# Patient Record
Sex: Female | Born: 1955 | Race: White | Marital: Married | Smoking: Never smoker
Health system: Southern US, Community
[De-identification: ages and names within clinical notes are randomized; demographics above are authoritative.]

## PROBLEM LIST (undated history)

## (undated) DIAGNOSIS — G4733 Obstructive sleep apnea (adult) (pediatric): Secondary | ICD-10-CM

## (undated) DIAGNOSIS — E119 Type 2 diabetes mellitus without complications: Secondary | ICD-10-CM

## (undated) DIAGNOSIS — M1990 Unspecified osteoarthritis, unspecified site: Secondary | ICD-10-CM

## (undated) DIAGNOSIS — I10 Essential (primary) hypertension: Secondary | ICD-10-CM

## (undated) HISTORY — PX: REPLACEMENT TOTAL KNEE: SUR1224

## (undated) HISTORY — PX: TOTAL SHOULDER REPLACEMENT: SUR1217

---

## 2018-08-29 ENCOUNTER — Ambulatory Visit
Admission: RE | Admit: 2018-08-29 | Discharge: 2018-08-29 | Disposition: A | Discharge status: Home / Self Care | Payer: Medicare Other | Source: Ambulatory Visit | Attending: Orthopaedic Surgery | Admitting: Orthopaedic Surgery

## 2018-08-29 ENCOUNTER — Other Ambulatory Visit: Payer: Self-pay | Admitting: Orthopaedic Surgery

## 2018-08-29 ENCOUNTER — Other Ambulatory Visit: Payer: Self-pay

## 2018-08-29 DIAGNOSIS — M5416 Radiculopathy, lumbar region: Secondary | ICD-10-CM

## 2020-09-01 IMAGING — MR MRI LUMBAR SPINE WITHOUT CONTRAST
4 of 5 series · 25 of 48 positions shown · non-contrast
Comparison: None.

CLINICAL DATA: Chronic low back pain.

EXAM:
MRI LUMBAR SPINE WITHOUT CONTRAST
TECHNIQUE: Multiplanar, multisequence MR imaging of the lumbar spine was
performed. No intravenous contrast was administered.

[Series 6: T2 · sagittal · 4.0mm · 0.84mm/px · 6 of 13 slices shown (1 of 2)]
[im 1/13]
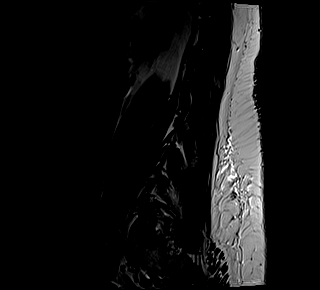
[im 3/13]
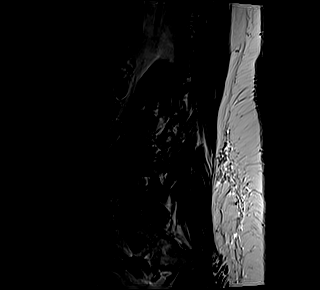
[im 5/13]
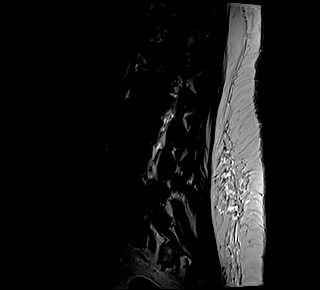
[im 8/13]
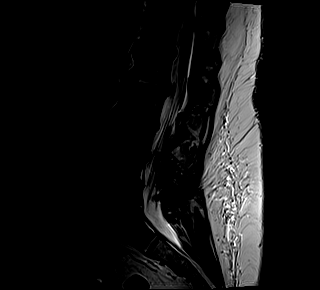
[im 10/13]
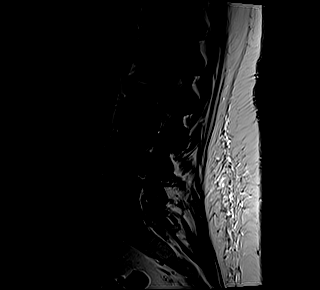
[im 13/13]
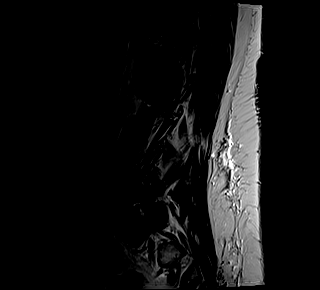

[Series 7: T1 · sagittal · 4.0mm · 0.84mm/px · 6 of 13 slices shown (1 of 2)]
[im 1/13]
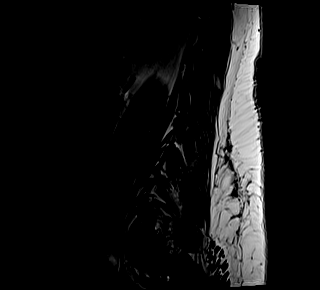
[im 3/13]
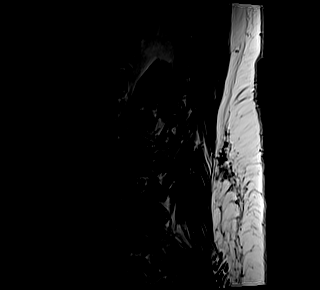
[im 5/13]
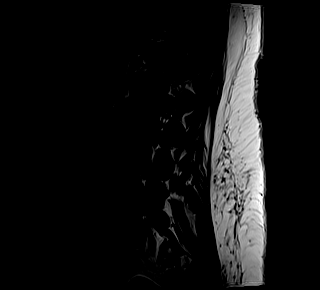
[im 8/13]
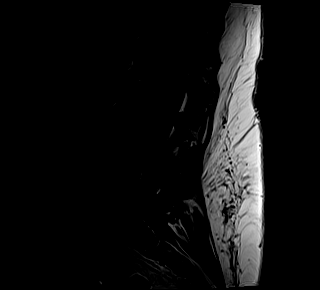
[im 10/13]
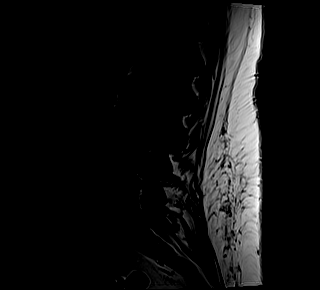
[im 13/13]
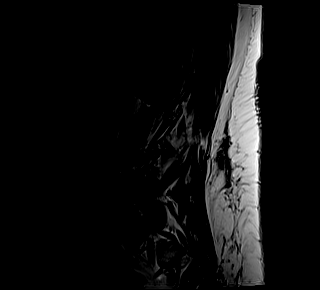

[Series 11: T1 · axial · 4.0mm · 0.35mm/px · z∈[-2,+140]mm · 4 of 34 slices shown (2 of 2)]
[im 1/34]
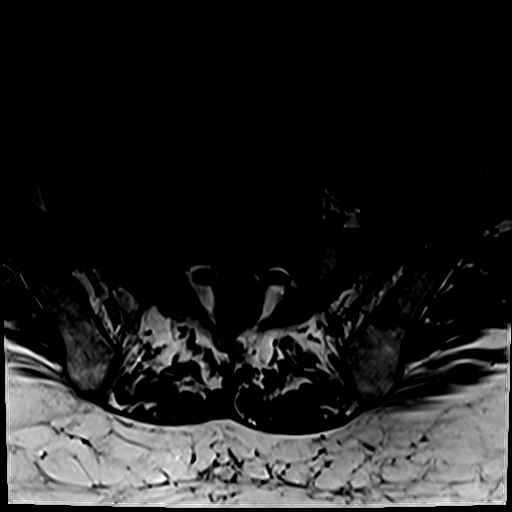
[im 5/34]
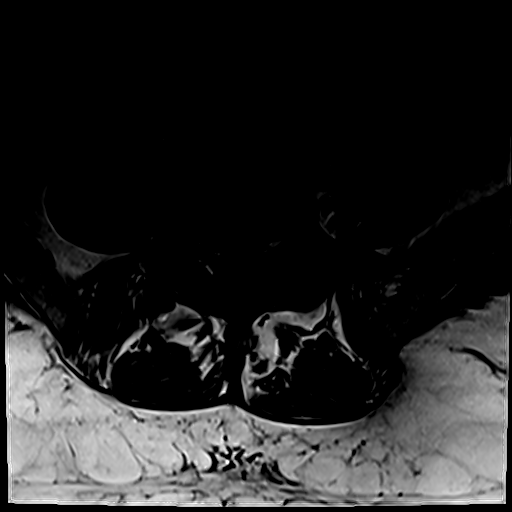
[im 17/34]
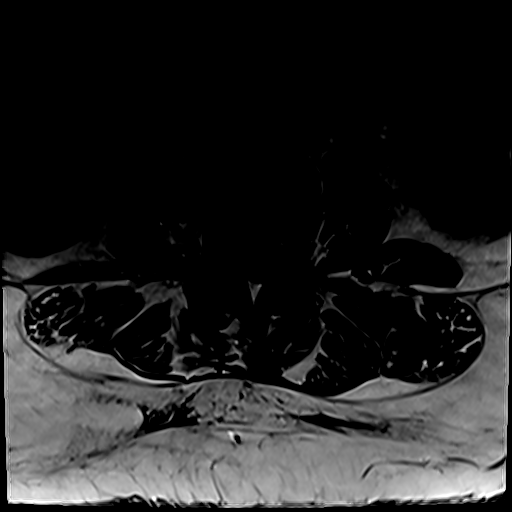
[im 29/34]
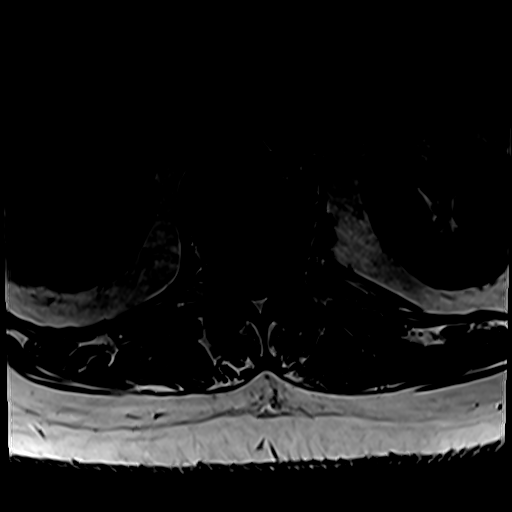

[Series 14: T2 · axial · 4.0mm · 0.35mm/px · z∈[-2,+170]mm · 9 of 34 slices shown (2 of 2)]
[im 1/34]
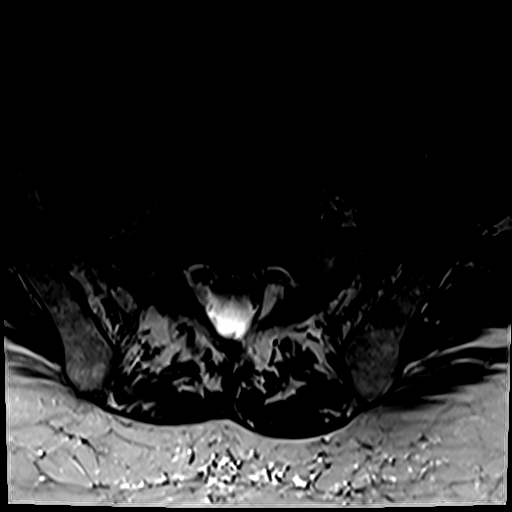
[im 5/34]
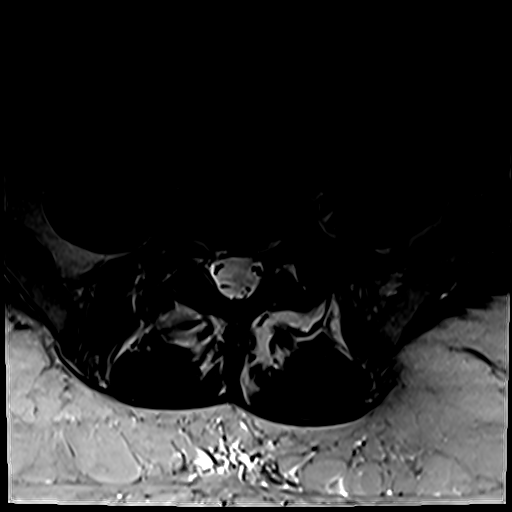
[im 10/34]
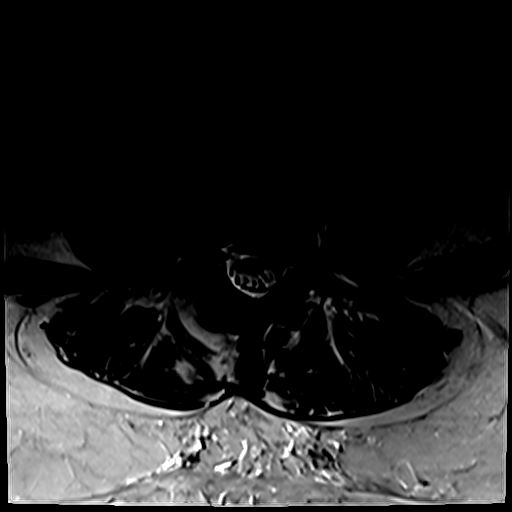
[im 15/34]
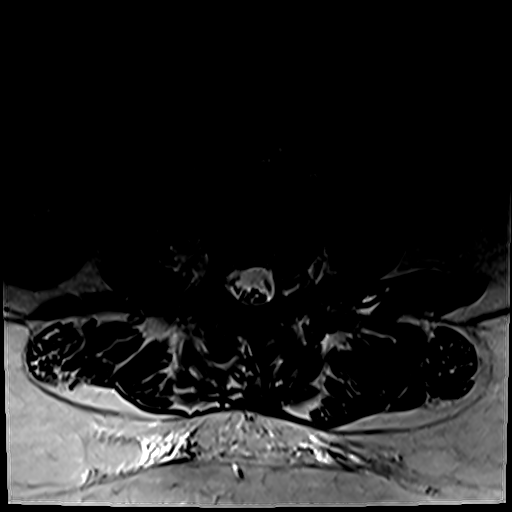
[im 17/34]
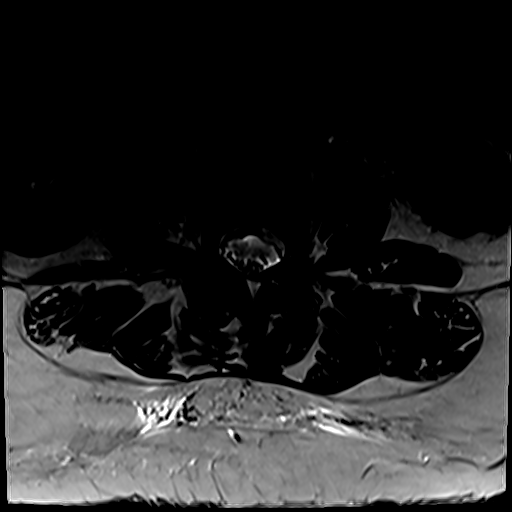
[im 19/34]
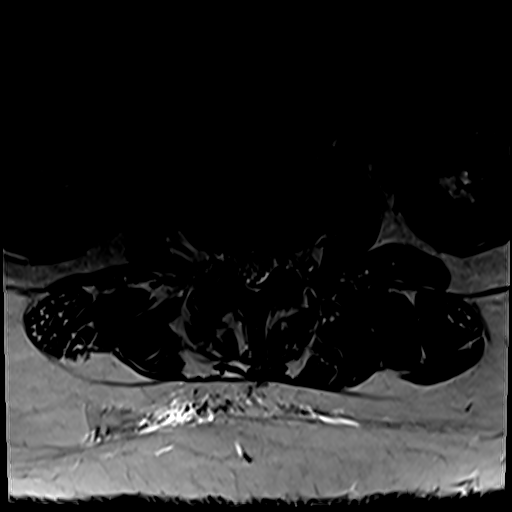
[im 24/34]
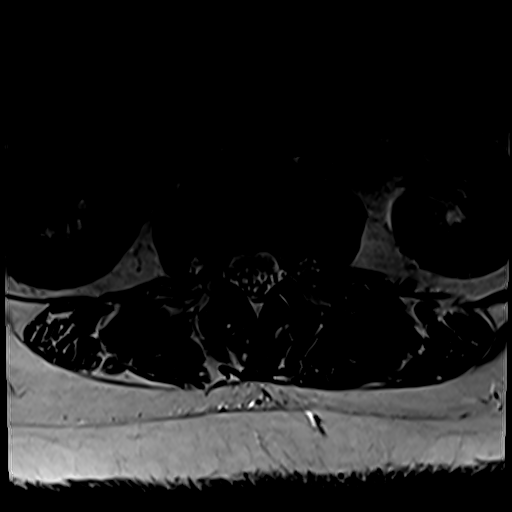
[im 29/34]
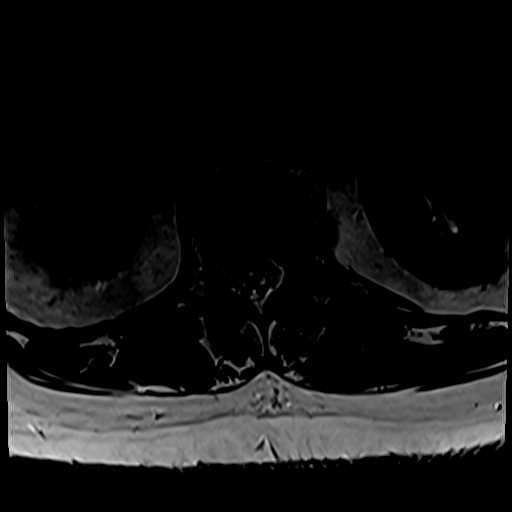
[im 34/34]
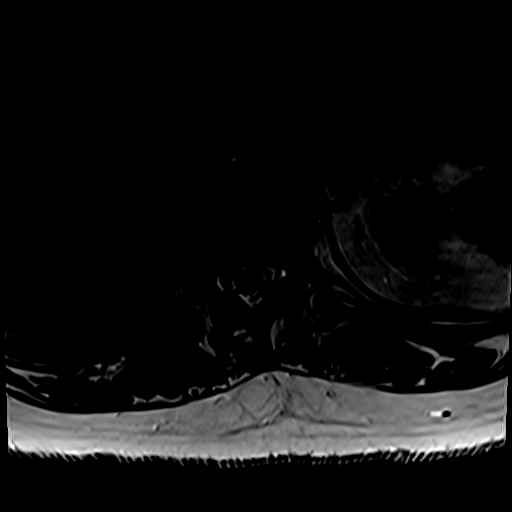

[25 of 48 positions shown; findings below may reference images not displayed]

FINDINGS: Segmentation: There are five lumbar type vertebral bodies. The last
full intervertebral disc space is labeled L5-S1.

Alignment: Normal overall alignment. There is a scoliotic curvature
and degenerative lumbar spondylosis. Mild degenerative
retrolisthesis of L2.

Vertebrae: Normal marrow signal. No bone lesions or fractures.
Degenerative endplate reactive changes are noted.

Conus medullaris and cauda equina: Conus extends to the L1 level.
Conus and cauda equina appear normal.

Paraspinal and other soft tissues: No significant findings.

Disc levels:

T12-L1: Degenerative disc disease and mild osteophytic ridging but
no significant spinal or foraminal stenosis.

L1-2: Advanced degenerate disc disease with significant disc space
narrowing. There is a broad-based bulging degenerated annulus and
osteophytic ridging with flattening of the ventral thecal sac and
mild bilateral lateral recess stenosis. No significant foraminal
stenosis.

L2-3: Disc disease and facet disease but no significant spinal or
foraminal stenosis. Mild bilateral lateral recess encroachment.

L3-4: Advanced disc disease and facet disease with a bulging
degenerated annulus, osteophytic ridging and ligamentum flavum
thickening. No significant spinal stenosis. There is mild to
moderate right lateral recess stenosis and also mild right foraminal
stenosis.

L4-5: Severe facet disease and a bulging degenerated annulus but the
spinal canal is fairly generous in is no significant spinal
stenosis. Mild bilateral lateral recess encroachment. Far right disc
osteophyte complex potentially irritating the extraforaminal right
L4 nerve root.

L5-S1: Advanced facet disease but no significant spinal stenosis.
Left foraminal/extraforaminal disc osteophyte complex potentially
irritating the left L5 nerve root.
IMPRESSION: 1. Scoliosis and degenerative lumbar spondylosis with multilevel
disc disease and facet disease.
2. Mild to moderate right lateral recess stenosis and mild right
foraminal stenosis at L3-4.
3. Far lateral right sided disc osteophyte complex at L4-5
potentially irritating the extraforaminal right L4 nerve root.
4. Left foraminal/extraforaminal disc osteophyte complex on the left
at L5-S1 potentially irritating the left L5 nerve root.

## 2021-08-30 ENCOUNTER — Other Ambulatory Visit: Payer: Self-pay

## 2021-08-30 ENCOUNTER — Encounter (HOSPITAL_BASED_OUTPATIENT_CLINIC_OR_DEPARTMENT_OTHER): Payer: Self-pay

## 2021-08-30 ENCOUNTER — Emergency Department (HOSPITAL_BASED_OUTPATIENT_CLINIC_OR_DEPARTMENT_OTHER)
Admission: EM | Admit: 2021-08-30 | Discharge: 2021-08-30 | Disposition: A | Discharge status: Home / Self Care | Payer: Medicare Other | Attending: Emergency Medicine | Admitting: Emergency Medicine

## 2021-08-30 DIAGNOSIS — E119 Type 2 diabetes mellitus without complications: Secondary | ICD-10-CM | POA: Diagnosis not present

## 2021-08-30 DIAGNOSIS — L02414 Cutaneous abscess of left upper limb: Secondary | ICD-10-CM | POA: Diagnosis not present

## 2021-08-30 DIAGNOSIS — M79632 Pain in left forearm: Secondary | ICD-10-CM | POA: Diagnosis present

## 2021-08-30 HISTORY — DX: Obstructive sleep apnea (adult) (pediatric): G47.33

## 2021-08-30 HISTORY — DX: Unspecified osteoarthritis, unspecified site: M19.90

## 2021-08-30 HISTORY — DX: Type 2 diabetes mellitus without complications: E11.9

## 2021-08-30 HISTORY — DX: Essential (primary) hypertension: I10

## 2021-08-30 LAB — CBG MONITORING, ED: Glucose-Capillary: 295 mg/dL — ABNORMAL HIGH (ref 70–99)

## 2021-08-30 MED ORDER — LIDOCAINE-EPINEPHRINE (PF) 2 %-1:200000 IJ SOLN
10.0000 mL | Freq: Once | INTRAMUSCULAR | Status: AC
  Administered 2021-08-30: 10 mL
  Filled 2021-08-30: qty 20

## 2021-08-30 NOTE — ED Provider Notes (Signed)
[facility] EMERGENCY DEPARTMENT Provider Note   CSN: [id number] Arrival date & time: 08/30/21  1539     History  Chief Complaint  Patient presents with   Wound Check   Abscess    Alexandra Stanton is a 66 y.o. female.  Patient with history of diabetes presents to the emergency department today for evaluation of left forearm abscess that started 1-2 weeks ago.  Patient thinks that she may have been bitten by something.  She had an area at first that was more like a mosquito bite.  Then this progressed into a warm, hard, red area that is now draining.  She did see her primary care provider who started her on doxycycline which she has been taking.  She continues to have redness and pain.  No current fevers, but did have a temperature of 102 F last week.  No vomiting.  She states that her sugars have been "running a little high".       Home Medications Prior to Admission medications   Not on File      Allergies    Erythromycin, Penicillins, Cephalexin, Clindamycin, Prednisone, Codeine, and Nsaids    Review of Systems   Review of Systems  Physical Exam Updated Vital Signs BP 132/90 (BP Location: Right Arm)   Pulse 96   Temp 98.6 F (37 C) (Oral)   Resp 17   Ht 5\' 4"  (1.626 m)   Wt 104.8 kg   SpO2 96%   BMI 39.65 kg/m  Physical Exam Vitals and nursing note reviewed.  Constitutional:      Appearance: She is well-developed.  HENT:     Head: Normocephalic and atraumatic.  Eyes:     Pupils: Pupils are equal, round, and reactive to light.  Cardiovascular:     Pulses: Normal pulses. No decreased pulses.  Musculoskeletal:        General: Tenderness present.     Cervical back: Normal range of motion and neck supple.     Comments: 3 cm area of induration with surrounding erythema consistent with abscess.  There is drainage of thin gray purulent material from the center of the induration.  Associated soft tissue is warm to the touch.  Skin:    General: Skin is  warm and dry.  Neurological:     Mental Status: She is alert.     Sensory: No sensory deficit.     Comments: Motor, sensation, and vascular distal to the injury is fully intact.   Psychiatric:        Mood and Affect: Mood normal.     ED Results / Procedures / Treatments   Labs (all labs ordered are listed, but only abnormal results are displayed) Labs Reviewed  CBG MONITORING, ED - Abnormal; Notable for the following components:      Result Value   Glucose-Capillary 295 (*)    All other components within normal limits  AEROBIC CULTURE W GRAM STAIN (SUPERFICIAL SPECIMEN)    EKG None  Radiology No results found.  Procedures Procedures    Medications Ordered in ED Medications  lidocaine-EPINEPHrine (XYLOCAINE W/EPI) 2 %-1:200000 (PF) injection 10 mL (has no administration in time range)    ED Course/ Medical Decision Making/ A&P    Patient seen and examined. History obtained directly from patient.   Labs/EKG: Ordered CBG, wound culture.  Imaging: None ordered  Medications/Fluids: Ordered: lidocaine 2% with epinephrine.   Most recent vital signs reviewed and are as follows: BP 132/90 (BP Location: Right Arm)  Pulse 96   Temp 98.6 F (37 C) (Oral)   Resp 17   Ht 5\' 4"  (1.626 m)   Wt 104.8 kg   SpO2 96%   BMI 39.65 kg/m   Initial impression: abscess and cellulitis  5:46 PM Reassessment performed. Patient appears stable. Tolerated I&D well. Wound culture sent.   Blood sugar is 295. No other objective findings to suggest DKA.   Reviewed pertinent lab work and imaging with patient at bedside. Questions answered.   Most current vital signs reviewed and are as follows: BP 132/90 (BP Location: Right Arm)   Pulse 96   Temp 98.6 F (37 C) (Oral)   Resp 17   Ht 5\' 4"  (1.626 m)   Wt 104.8 kg   SpO2 96%   BMI 39.65 kg/m   Plan: Discharge to home.   Prescriptions written for: None  Other home care instructions discussed: Continue doxycycline, warm  water soaks twice a day  ED return instructions discussed: The patient was urged to return to the Emergency Department urgently with worsening pain, swelling, expanding erythema especially if it streaks away from the affected area, fever, or if they have any other concerns.   The patient verbalized understanding and stated agreement with this plan.   Follow-up instructions discussed: Patient encouraged to follow-up with their PCP in 2 days.                           Medical Decision Making Risk Prescription drug management.   Forearm abscess in diabetic. No fever or signs of sepsis. Hyperglycemia but doubt DKA. Low concern for necrotizing infection.         Final Clinical Impression(s) / ED Diagnoses Final diagnoses:  Abscess of forearm, left    Rx / DC Orders ED Discharge Orders     None         , PA-C 08/30/21 1748    Renne Crigler, DO 09/02/21 [phone]

## 2021-08-30 NOTE — Discharge Instructions (Signed)
Please read and follow all provided instructions.  Your diagnoses today include:  1. Abscess of forearm, left     Tests performed today include: Vital signs. See below for your results today.   Medications prescribed:  None  Take any prescribed medications only as directed.   Home care instructions:  Follow any educational materials contained in this packet  Follow-up instructions: Return to see your provider in 48 hours for a recheck if your symptoms are not significantly improved.  Return instructions:  Return to the Emergency Department if you have: Fever Worsening symptoms Worsening pain Worsening swelling Redness of the skin that moves away from the affected area, especially if it streaks away from the affected area  Any other emergent concerns   Your vital signs today were: BP 132/90 (BP Location: Right Arm)   Pulse 96   Temp 98.6 F (37 C) (Oral)   Resp 17   Ht 5\' 4"  (1.626 m)   Wt 104.8 kg   SpO2 96%   BMI 39.65 kg/m  If your blood pressure (BP) was elevated above 135/85 this visit, please have this repeated by your doctor within one month. --------------

## 2021-08-30 NOTE — ED Notes (Signed)
Patient Alert and oriented to baseline. Stable and ambulatory to baseline. Patient verbalized understanding of the discharge instructions.  Patient belongings were taken by the patient.   

## 2021-08-30 NOTE — ED Triage Notes (Signed)
States got what looked like an insect bite 1 week & 4 days ago. Given doxycycline by PCP at that point for cellulitis. Saw PCP who sent here to have it drained as it worsened.

## 2021-09-02 LAB — AEROBIC CULTURE W GRAM STAIN (SUPERFICIAL SPECIMEN)

## 2021-09-03 ENCOUNTER — Telehealth: Payer: Self-pay | Admitting: *Deleted

## 2021-09-03 NOTE — Telephone Encounter (Signed)
Post ED Visit - Positive Culture Follow-up  Culture report reviewed by antimicrobial stewardship pharmacist: [facility] []  Nathan Batchelder, Pharm.D. []  , .D., BCPS AQ-ID []  Celedonio Miyamoto, Pharm.D., BCPS []  [address], Pharm.D., BCPS []  [place], Garvin Fila.D., BCPS, AAHIVP []  , Pharm.D., BCPS, AAHIVP []  Georgina Pillion, PharmD, BCPS []  , PharmD, BCPS []  [place], PharmD, BCPS []  [address], PharmD []  , PharmD, BCPS []  Estella Husk, PharmD  Pharmacy Team []  Lysle Pearl, PharmD []  , PharmD []  Phillips Climes, PharmD []  , Rph []  Agapito Games) , PharmD []  Verlan Friends, PharmD []  , PharmD []  Mervyn Gay, PharmD []  , PharmD []  Vinnie Level, PharmD []  [facility], PharmD []  , PharmD []  Len Childs, PharmD   Positive wound culture Treated with Doxycycline, organism sensitive to the same and no further patient follow-up is required at this time. , PharmD  Greer Pickerel Talley 09/03/2021, 10:37 AM

## 2022-07-21 ENCOUNTER — Other Ambulatory Visit: Payer: Self-pay

## 2022-07-21 ENCOUNTER — Encounter (HOSPITAL_BASED_OUTPATIENT_CLINIC_OR_DEPARTMENT_OTHER): Payer: Self-pay | Admitting: Emergency Medicine

## 2022-07-21 ENCOUNTER — Emergency Department (HOSPITAL_BASED_OUTPATIENT_CLINIC_OR_DEPARTMENT_OTHER)
Admission: EM | Admit: 2022-07-21 | Discharge: 2022-07-21 | Disposition: A | Discharge status: Home / Self Care | Payer: Medicare Other | Attending: Emergency Medicine | Admitting: Emergency Medicine

## 2022-07-21 DIAGNOSIS — S20452A Superficial foreign body of left back wall of thorax, initial encounter: Secondary | ICD-10-CM | POA: Insufficient documentation

## 2022-07-21 DIAGNOSIS — X58XXXA Exposure to other specified factors, initial encounter: Secondary | ICD-10-CM | POA: Diagnosis not present

## 2022-07-21 DIAGNOSIS — I10 Essential (primary) hypertension: Secondary | ICD-10-CM | POA: Insufficient documentation

## 2022-07-21 DIAGNOSIS — E119 Type 2 diabetes mellitus without complications: Secondary | ICD-10-CM | POA: Insufficient documentation

## 2022-07-21 DIAGNOSIS — Z1839 Other retained organic fragments: Secondary | ICD-10-CM

## 2022-07-21 DIAGNOSIS — L02411 Cutaneous abscess of right axilla: Secondary | ICD-10-CM | POA: Diagnosis not present

## 2022-07-21 MED ORDER — DOXYCYCLINE HYCLATE 100 MG PO CAPS: 100.0000 mg | ORAL_CAPSULE | Freq: Two times a day (BID) | ORAL | 0 refills | Status: DC

## 2022-07-21 MED ORDER — LIDOCAINE-EPINEPHRINE (PF) 2 %-1:200000 IJ SOLN
20.0000 mL | Freq: Once | INTRAMUSCULAR | Status: AC
  Administered 2022-07-21: 20 mL
  Filled 2022-07-21: qty 20

## 2022-07-21 NOTE — ED Triage Notes (Signed)
Abscess on left upper back x 5 days. "Knot" under right arm x 3 days.

## 2022-07-21 NOTE — Discharge Instructions (Addendum)
You were seen in the emergency department for an abscess.  We have drained the area and cleaned it. I would like you to have the wound checked in 2-3 days. This can be done by any doctor's office, urgent care, or emergency department. This is to make sure the area hasn't closed too soon. Try to keep the area as clean and dry as possible. It is okay to let warm soapy water run over the area, but do NOT scrub the area.   I am placing you on a course of antibiotics. It is important you finish the entire course! You can take ibuprofen or tylenol as needed for pain.   Continue to monitor how you're doing and return to the ER for new or worsening symptoms.  

## 2022-07-21 NOTE — ED Provider Notes (Signed)
[facility] EMERGENCY DEPARTMENT AT [facility] Provider Note   CSN: [id number] Arrival date & time: 07/21/22  5102     History  Chief Complaint  Patient presents with   Abscess    Alexandra Stanton is a 67 y.o. female.  Patient with history of diabetes and hypertension presents today with complaints of abscess.  She states that she has 1 abscess on her left upper back which she first noted 5 days ago after she pulled a tick off the area which has been becoming progressively more swollen and painful since.  Also endorses a second abscess in her right axilla that formed 3 days ago.  She is attempted to pop both of these without any success.  Endorses pain to both.  States she is been here previously with abscesses requiring drainage and states that this feels similar.  Denies shortness of breath, fevers, chills, nausea, or vomiting.     The history is provided by the patient. No language interpreter was used.  Abscess      Home Medications Prior to Admission medications   Not on File      Allergies    Erythromycin, Penicillins, Cephalexin, Clindamycin, Prednisone, Codeine, and Nsaids    Review of Systems   Review of Systems  Skin:  Positive for wound.  All other systems reviewed and are negative.   Physical Exam Updated Vital Signs BP (!) 144/96 (BP Location: Left Arm)   Pulse (!) 108   Temp 98.7 F (37.1 C) (Oral)   Resp 18   Ht 5\' 3"  (1.6 m)   Wt 108 kg   SpO2 96%   BMI 42.16 kg/m  Physical Exam Vitals and nursing note reviewed.  Constitutional:      General: She is not in acute distress.    Appearance: Normal appearance. She is normal weight. She is not ill-appearing, toxic-appearing or diaphoretic.  HENT:     Head: Normocephalic and atraumatic.  Cardiovascular:     Rate and Rhythm: Normal rate.  Pulmonary:     Effort: Pulmonary effort is normal. No respiratory distress.  Musculoskeletal:        General: Normal range of motion.     Cervical  back: Normal range of motion.  Skin:    General: Skin is warm and dry.     Comments: 3 cm x 2 cm area of fluctuance with surrounding induration noted within the right axilla.  No active drainage. ROM intact. Radial and ulnar pulses 2+  2 cm x 2 cm area of erythema with a small black spot in the center. No fluctuance, induration, or drainage.  Neurological:     General: No focal deficit present.     Mental Status: She is alert.  Psychiatric:        Mood and Affect: Mood normal.        Behavior: Behavior normal.     ED Results / Procedures / Treatments   Labs (all labs ordered are listed, but only abnormal results are displayed) Labs Reviewed - No data to display  EKG None  Radiology No results found.  Procedures .Marland KitchenIncision and Drainage  Date/Time: 07/21/2022 10:47 AM  Performed by: Silva Bandy, PA-C Authorized by: Silva Bandy, PA-C   Consent:    Consent obtained:  Verbal   Consent given by:  Patient   Risks, benefits, and alternatives were discussed: yes     Risks discussed:  Bleeding, incomplete drainage, pain, damage to other organs and infection  Alternatives discussed:  No treatment, delayed treatment, alternative treatment, observation and referral Universal protocol:    Procedure explained and questions answered to patient or proxy's satisfaction: yes     Patient identity confirmed:  Verbally with patient Location:    Type:  Abscess   Size:  3 cm x 2 cm   Location: right axilla. Pre-procedure details:    Skin preparation:  Povidone-iodine Anesthesia:    Anesthesia method:  Local infiltration   Local anesthetic:  Lidocaine 2% WITH epi Procedure type:    Complexity:  Simple Procedure details:    Incision types:  Cruciate   Wound management:  Probed and deloculated   Drainage:  Purulent   Drainage amount:  Moderate   Wound treatment:  Wound left open   Packing materials:  None Post-procedure details:    Procedure completion:  Tolerated well, no  immediate complications .Foreign Body Removal  Date/Time: 07/21/2022 10:48 AM  Performed by: Silva Bandy, PA-C Authorized by: Silva Bandy, PA-C  Consent: Verbal consent obtained. Risks and benefits: risks, benefits and alternatives were discussed Consent given by: patient Patient understanding: patient states understanding of the procedure being performed Patient consent: the patient's understanding of the procedure matches consent given Procedure consent: procedure consent matches procedure scheduled Relevant documents: relevant documents present and verified Required items: required blood products, implants, devices, and special equipment available Patient identity confirmed: verbally with patient Body area: skin General location: trunk Location details: back Anesthesia: local infiltration  Anesthesia: Local Anesthetic: lidocaine 2% with epinephrine Anesthetic total: 3 mL Removal mechanism: forceps and scalpel Dressing: antibiotic ointment and dressing applied Depth: subcutaneous Complexity: simple 1 objects recovered. Objects recovered: black object presumed to be part of a tick Post-procedure assessment: foreign body removed Patient tolerance: patient tolerated the procedure well with no immediate complications      Medications Ordered in ED Medications  lidocaine-EPINEPHrine (XYLOCAINE W/EPI) 2 %-1:200000 (PF) injection 20 mL (has no administration in time range)    ED Course/ Medical Decision Making/ A&P                             Medical Decision Making Risk Prescription drug management.   Patient presents today with complaints of abscess to her right axilla and retained tick fragment in her left upper back. She is afebrile, non-toxic appearing, and in no acute distress with reassuring vital signs.  Right axilla abscess successfully drained per above procedure. Retained foreign body successfully removed from the left upper back per above procedure.  Chart  reviewed, it does appear that patient had an abscess drained previously and her culture grew MRSA.  Given this, will place on doxycycline which previous culture was susceptible to.  Abscess was not large enough to warrant packing or drain, recommend wound recheck with PCP in 2 days. Encouraged home warm soaks and flushing.  Evaluation and diagnostic testing in the emergency department does not suggest an emergent condition requiring admission or immediate intervention beyond what has been performed at this time.  Plan for discharge with close PCP follow-up.  Patient is understanding and amenable with plan, educated on red flag symptoms that would prompt immediate return.  Patient discharged in stable condition.   Final Clinical Impression(s) / ED Diagnoses Final diagnoses:  Abscess of axilla, right  Retained tick parts of upper back excluding scapular region    Rx / DC Orders ED Discharge Orders  Ordered    doxycycline (VIBRAMYCIN) 100 MG capsule  2 times daily        07/21/22 1054          An After Visit Summary was printed and given to the patient.     Vear Clock 07/21/22 1055    Gwyneth Sprout, MD 07/25/22 2328

## 2023-05-15 ENCOUNTER — Other Ambulatory Visit: Payer: Self-pay

## 2023-05-15 ENCOUNTER — Encounter (HOSPITAL_BASED_OUTPATIENT_CLINIC_OR_DEPARTMENT_OTHER): Payer: Self-pay | Admitting: Urology

## 2023-05-15 ENCOUNTER — Emergency Department (HOSPITAL_BASED_OUTPATIENT_CLINIC_OR_DEPARTMENT_OTHER)
Admission: EM | Admit: 2023-05-15 | Discharge: 2023-05-15 | Disposition: A | Discharge status: Home / Self Care | Attending: Emergency Medicine | Admitting: Emergency Medicine

## 2023-05-15 ENCOUNTER — Emergency Department (HOSPITAL_BASED_OUTPATIENT_CLINIC_OR_DEPARTMENT_OTHER)

## 2023-05-15 DIAGNOSIS — E119 Type 2 diabetes mellitus without complications: Secondary | ICD-10-CM | POA: Insufficient documentation

## 2023-05-15 DIAGNOSIS — I10 Essential (primary) hypertension: Secondary | ICD-10-CM | POA: Diagnosis not present

## 2023-05-15 DIAGNOSIS — J441 Chronic obstructive pulmonary disease with (acute) exacerbation: Secondary | ICD-10-CM | POA: Insufficient documentation

## 2023-05-15 DIAGNOSIS — Z7952 Long term (current) use of systemic steroids: Secondary | ICD-10-CM | POA: Diagnosis not present

## 2023-05-15 DIAGNOSIS — R0602 Shortness of breath: Secondary | ICD-10-CM | POA: Diagnosis present

## 2023-05-15 LAB — TROPONIN I (HIGH SENSITIVITY): Troponin I (High Sensitivity): 6 ng/L (ref <18)

## 2023-05-15 LAB — CBC WITH DIFFERENTIAL/PLATELET
Abs Immature Granulocytes: 0.02 10*3/uL (ref 0.00–0.07)
Basophils Absolute: 0 10*3/uL (ref 0.0–0.1)
Basophils Relative: 1 %
Eosinophils Absolute: 0.1 10*3/uL (ref 0.0–0.5)
Eosinophils Relative: 2 %
HCT: 42.9 % (ref 36.0–46.0)
Hemoglobin: 13.3 g/dL (ref 12.0–15.0)
Immature Granulocytes: 0 %
Lymphocytes Relative: 20 %
Lymphs Abs: 1.2 10*3/uL (ref 0.7–4.0)
MCH: 24.4 pg — ABNORMAL LOW (ref 26.0–34.0)
MCHC: 31 g/dL (ref 30.0–36.0)
MCV: 78.6 fL — ABNORMAL LOW (ref 80.0–100.0)
Monocytes Absolute: 0.5 10*3/uL (ref 0.1–1.0)
Monocytes Relative: 9 %
Neutro Abs: 4 10*3/uL (ref 1.7–7.7)
Neutrophils Relative %: 68 %
Platelets: 308 10*3/uL (ref 150–400)
RBC: 5.46 MIL/uL — ABNORMAL HIGH (ref 3.87–5.11)
RDW: 16.8 % — ABNORMAL HIGH (ref 11.5–15.5)
WBC: 5.9 10*3/uL (ref 4.0–10.5)
nRBC: 0 % (ref 0.0–0.2)

## 2023-05-15 LAB — COMPREHENSIVE METABOLIC PANEL WITH GFR
ALT: 18 U/L (ref 0–44)
AST: 26 U/L (ref 15–41)
Albumin: 3.9 g/dL (ref 3.5–5.0)
Alkaline Phosphatase: 44 U/L (ref 38–126)
Anion gap: 11 (ref 5–15)
BUN: 18 mg/dL (ref 8–23)
CO2: 26 mmol/L (ref 22–32)
Calcium: 9.3 mg/dL (ref 8.9–10.3)
Chloride: 100 mmol/L (ref 98–111)
Creatinine, Ser: 0.89 mg/dL (ref 0.44–1.00)
GFR, Estimated: >60 mL/min (ref >60)
Glucose, Bld: 104 mg/dL — ABNORMAL HIGH (ref 70–99)
Potassium: 3.5 mmol/L (ref 3.5–5.1)
Sodium: 137 mmol/L (ref 135–145)
Total Bilirubin: 0.5 mg/dL (ref 0.0–1.2)
Total Protein: 7.1 g/dL (ref 6.5–8.1)

## 2023-05-15 LAB — LIPASE, BLOOD: Lipase: 41 U/L (ref 11–51)

## 2023-05-15 LAB — BRAIN NATRIURETIC PEPTIDE: B Natriuretic Peptide: 25.3 pg/mL (ref 0.0–100.0)

## 2023-05-15 MED ORDER — ONDANSETRON HCL 4 MG/2ML IJ SOLN
4.0000 mg | Freq: Once | INTRAMUSCULAR | Status: AC
  Administered 2023-05-15: 4 mg via INTRAVENOUS
  Filled 2023-05-15: qty 2

## 2023-05-15 MED ORDER — GUAIFENESIN-CODEINE 100-10 MG/5ML PO SOLN: 5.0000 mL | Freq: Two times a day (BID) | ORAL | 0 refills | Status: AC | PRN

## 2023-05-15 MED ORDER — IPRATROPIUM-ALBUTEROL 0.5-2.5 (3) MG/3ML IN SOLN
3.0000 mL | Freq: Once | RESPIRATORY_TRACT | Status: AC
  Administered 2023-05-15: 3 mL via RESPIRATORY_TRACT
  Filled 2023-05-15: qty 3

## 2023-05-15 MED ORDER — METHYLPREDNISOLONE SODIUM SUCC 125 MG IJ SOLR
125.0000 mg | Freq: Once | INTRAMUSCULAR | Status: AC
  Administered 2023-05-15: 125 mg via INTRAVENOUS
  Filled 2023-05-15: qty 2

## 2023-05-15 MED ORDER — ALBUTEROL SULFATE HFA 108 (90 BASE) MCG/ACT IN AERS: 1.0000 | INHALATION_SPRAY | Freq: Four times a day (QID) | RESPIRATORY_TRACT | 0 refills | Status: AC | PRN

## 2023-05-15 MED ORDER — DOXYCYCLINE HYCLATE 100 MG PO CAPS: 100.0000 mg | ORAL_CAPSULE | Freq: Two times a day (BID) | ORAL | 0 refills | Status: AC

## 2023-05-15 MED ORDER — OXYMETAZOLINE HCL 0.05 % NA SOLN: 1.0000 | Freq: Two times a day (BID) | NASAL | 0 refills | Status: AC

## 2023-05-15 MED ORDER — SODIUM CHLORIDE 0.9 % IV BOLUS
1000.0000 mL | Freq: Once | INTRAVENOUS | Status: AC
  Administered 2023-05-15: 1000 mL via INTRAVENOUS

## 2023-05-15 MED ORDER — PREDNISONE 20 MG PO TABS: 40.0000 mg | ORAL_TABLET | Freq: Every day | ORAL | 0 refills | Status: AC

## 2023-05-15 NOTE — ED Triage Notes (Signed)
 Pt states has been sick x 2 weeks  Given doxy and prednisone and finished course  Felt better but over past week has gotten sick again  States nasal congestion, fatigue, weakness, SOB  Neg for RSV and COVID at [facility]  Reports some N/V/D as well that started last night

## 2023-05-15 NOTE — ED Notes (Signed)
 Fall risk armband Fall risk sign on door  Fall risk socks

## 2023-05-15 NOTE — Discharge Instructions (Signed)
 It was a pleasure caring for you today in the emergency department.  Please return to the emergency department for any worsening or worrisome symptoms.

## 2023-05-15 NOTE — ED Provider Notes (Signed)
 [place] [facility]GENCY DEPARTMENT AT [facility] [place] Provider Note  CSN: [id number] Arrival date & time: 05/15/23 1553  Chief Complaint(s) No chief complaint on file.  HPI Alexandra Stanton is a 68 y.o. female with past medical history as below, significant for arthritis, hypertension, DM, OSA on CPAP, COPD who presents to the ED with complaint of nasal congestion, fatigue, dyspnea  She was seen at [facility] on 3/29, she left AMA, she presented there with similar complaints, difficulty breathing, cough, congestion, URI symptoms.  There is concern for possible COPD exacerbation, there was plan to obtain CTA but patient left AMA prior to imaging and remainder of workup.  Reports she took leftover doxycycline and some leftover prednisone which did provide some relief of her symptoms  She has been having cough, congestion, rhinorrhea, body aches over the past 4-5 days.  She began having diarrhea the past 24 hours.  Vomiting, nausea, abdominal cramping.  Poor p.o. intake secondary to nausea.  No abdominal pain currently.  Emesis nonbloody nonbilious, no melena or bloody stool reported.  She has been taking leftover prednisone and doxycycline from prior illness, nasal spray she got the pharmacy.    Past Medical History Past Medical History:  Diagnosis Date   Arthritis    Diabetes mellitus without complication (HCC)    Hypertension    Obstructive sleep apnea on CPAP    There are no active problems to display for this patient.  Home Medication(s) Prior to Admission medications   Medication Sig Start Date End Date Taking? Authorizing Provider  albuterol (VENTOLIN HFA) 108 (90 Base) MCG/ACT inhaler Inhale 1-2 puffs into the lungs every 6 (six) hours as needed for wheezing or shortness of breath. 05/15/23  Yes Tanda Rockers A, DO  doxycycline (VIBRAMYCIN) 100 MG capsule Take 1 capsule (100 mg total) by mouth 2 (two) times daily for 7 days. 05/15/23 05/22/23 Yes Tanda Rockers A, DO  guaiFENesin-codeine  100-10 MG/5ML syrup Take 5 mLs by mouth 2 (two) times daily as needed for cough. 05/15/23  Yes Sloan Leiter, DO  oxymetazoline (AFRIN NASAL SPRAY) 0.05 % nasal spray Place 1 spray into both nostrils 2 (two) times daily for 3 days. 05/15/23 05/18/23 Yes Sloan Leiter, DO  predniSONE (DELTASONE) 20 MG tablet Take 2 tablets (40 mg total) by mouth daily for 5 days. 05/15/23 05/20/23 Yes Sloan Leiter, DO                                                                                                                                    Past Surgical History Past Surgical History:  Procedure Laterality Date   REPLACEMENT TOTAL KNEE Left    TOTAL SHOULDER REPLACEMENT Left    Family History History reviewed. No pertinent family history.  Social History Social History   Tobacco Use   Smoking status: Never   Smokeless tobacco: Never  Vaping Use   Vaping status: Never  Used  Substance Use Topics   Alcohol use: Never   Drug use: Never   Allergies Erythromycin, Penicillins, Cephalexin, Clindamycin, Codeine, and Nsaids  Review of Systems A thorough review of systems was obtained and all systems are negative except as noted in the HPI and PMH.   Physical Exam Vital Signs  I have reviewed the triage vital signs BP 138/70   Pulse 91   Temp 99.4 F (37.4 C)   Resp (!) 25   Ht 5\' 3"  (1.6 m)   Wt 108 kg   SpO2 94%   BMI 42.18 kg/m  Physical Exam Vitals and nursing note reviewed.  Constitutional:      General: She is not in acute distress.    Appearance: Normal appearance. She is obese. She is not ill-appearing.  HENT:     Head: Normocephalic and atraumatic.     Right Ear: External ear normal.     Left Ear: External ear normal.     Nose: Nose normal.     Mouth/Throat:     Mouth: Mucous membranes are moist.  Eyes:     General: No scleral icterus.       Right eye: No discharge.        Left eye: No discharge.  Cardiovascular:     Rate and Rhythm: Normal rate and regular rhythm.      Pulses: Normal pulses.     Heart sounds: Normal heart sounds.  Pulmonary:     Effort: Pulmonary effort is normal. No respiratory distress.     Breath sounds: No stridor. Wheezing present.  Abdominal:     General: Abdomen is flat. There is no distension.     Palpations: Abdomen is soft.     Tenderness: There is no abdominal tenderness. There is no guarding.  Musculoskeletal:     Cervical back: No rigidity.     Right lower leg: No edema.     Left lower leg: No edema.  Skin:    General: Skin is warm and dry.     Capillary Refill: Capillary refill takes less than 2 seconds.  Neurological:     Mental Status: She is alert.  Psychiatric:        Mood and Affect: Mood normal.        Behavior: Behavior normal. Behavior is cooperative.     ED Results and Treatments Labs (all labs ordered are listed, but only abnormal results are displayed) Labs Reviewed  CBC WITH DIFFERENTIAL/PLATELET - Abnormal; Notable for the following components:      Result Value   RBC 5.46 (*)    MCV 78.6 (*)    MCH 24.4 (*)    RDW 16.8 (*)    All other components within normal limits  COMPREHENSIVE METABOLIC PANEL WITH GFR - Abnormal; Notable for the following components:   Glucose, Bld 104 (*)    All other components within normal limits  LIPASE, BLOOD  BRAIN NATRIURETIC PEPTIDE  URINALYSIS, ROUTINE W REFLEX MICROSCOPIC  TROPONIN I (HIGH SENSITIVITY)  TROPONIN I (HIGH SENSITIVITY)  Radiology DG Chest 2 View Result Date: 05/15/2023 CLINICAL DATA:  Productive cough. Recently completed medications with progressive symptoms. Weakness, cough and shortness of breath. EXAM: CHEST - 2 VIEW COMPARISON:  Radiographs 05/13/2023 and 04/04/2023.  CT 04/05/2023. FINDINGS: Persistent low lung volumes. The heart size and mediastinal contours are stable. Known pulmonary nodularity on CT is not well seen  radiographically. There is stable mild chronic central airway thickening. No confluent airspace disease, pleural effusion or pneumothorax. There are stable degenerative changes in the spine. Previous left shoulder reverse arthroplasty with moderate right shoulder degenerative changes. IMPRESSION: Radiographically stable appearance of the chest with chronic central airway thickening. No evidence of pneumonia. Electronically Signed   By: Carey Bullocks M.D.   On: 05/15/2023 16:46    Pertinent labs & imaging results that were available during my care of the patient were reviewed by me and considered in my medical decision making (see MDM for details).  Medications Ordered in ED Medications  ipratropium-albuterol (DUONEB) 0.5-2.5 (3) MG/3ML nebulizer solution 3 mL (3 mLs Nebulization Given 05/15/23 1801)  methylPREDNISolone sodium succinate (SOLU-MEDROL) 125 mg/2 mL injection 125 mg (125 mg Intravenous Given 05/15/23 1804)  sodium chloride 0.9 % bolus 1,000 mL (1,000 mLs Intravenous New Bag/Given 05/15/23 1804)  ondansetron (ZOFRAN) injection 4 mg (4 mg Intravenous Given 05/15/23 1803)                                                                                                                                     Procedures Procedures  (including critical care time)  Medical Decision Making / ED Course    Medical Decision Making:    Mayuri Staples is a 68 y.o. female  with past medical history as below, significant for arthritis, hypertension, DM, OSA on CPAP, COPD who presents to the ED with complaint of nasal congestion, fatigue, dyspnea. The complaint involves an extensive differential diagnosis and also carries with it a high risk of complications and morbidity.  Serious etiology was considered. Ddx includes but is not limited to: In my evaluation of this patient's dyspnea my DDx includes, but is not limited to, pneumonia, pulmonary embolism, pneumothorax, pulmonary edema, metabolic acidosis,  asthma, COPD, cardiac cause, anemia, anxiety, etc.    Complete initial physical exam performed, notably the patient was in no acute distress, no hypoxia.    Reviewed and confirmed nursing documentation for past medical history, family history, social history.  Vital signs reviewed.      Clinical Course as of 05/15/23 2048  Mon May 15, 2023  2035 Feeling much better [SG]    Clinical Course User Index [SG] Sloan Leiter, DO    Brief summary: 68 year old female history of OSA, COPD, asthma here with cough, URI symptoms, nausea vomiting diarrhea.  No hypoxia, assessment, abdomen is nonperitoneal, wheezing noted bilateral, no leg swelling.  Will check screening labs, give DuoNebs, Solu-Medrol, IV fluids.  Labs reviewed and these are stable.  She is feeling much better after nebulized breathing treatment, steroids and fluids.  She is ambulatory steady gait, no hypoxia, she is tolerant p.o. the difficulty.  Feels like her breathing is greatly improved.  Wheezing has resolved.  She is feeling back to normal, requesting discharge. Concern for mild COPD exacerbation.  Steroids for home, doxycycline, albuterol, also discussed symptomatic treatment for URI type symptoms.  Encourage follow-up PCP  The patient improved significantly and was discharged in stable condition. Detailed discussions were had with the patient/guardian regarding current findings, and need for close f/u with PCP or on call doctor. The patient/guardian has been instructed to return immediately if the symptoms worsen in any way for re-evaluation. Patient/guardian verbalized understanding and is in agreement with current care plan. All questions answered prior to discharge.               Additional history obtained: -Additional history obtained from na -External records from outside source obtained and reviewed including: Chart review including previous notes, labs, imaging, consultation notes including  Recent OSH  evaluation, home medications, allergy list, prior labs   Lab Tests: -I ordered, reviewed, and interpreted labs.   The pertinent results include:   Labs Reviewed  CBC WITH DIFFERENTIAL/PLATELET - Abnormal; Notable for the following components:      Result Value   RBC 5.46 (*)    MCV 78.6 (*)    MCH 24.4 (*)    RDW 16.8 (*)    All other components within normal limits  COMPREHENSIVE METABOLIC PANEL WITH GFR - Abnormal; Notable for the following components:   Glucose, Bld 104 (*)    All other components within normal limits  LIPASE, BLOOD  BRAIN NATRIURETIC PEPTIDE  URINALYSIS, ROUTINE W REFLEX MICROSCOPIC  TROPONIN I (HIGH SENSITIVITY)  TROPONIN I (HIGH SENSITIVITY)    Notable for labs stable  EKG   EKG Interpretation Date/Time:    Ventricular Rate:    PR Interval:    QRS Duration:    QT Interval:    QTC Calculation:   R Axis:      Text Interpretation:           Imaging Studies ordered: I ordered imaging studies including cxr I independently visualized the following imaging with scope of interpretation limited to determining acute life threatening conditions related to emergency care; findings noted above I independently visualized and interpreted imaging. I agree with the radiologist interpretation   Medicines ordered and prescription drug management: Meds ordered this encounter  Medications   ipratropium-albuterol (DUONEB) 0.5-2.5 (3) MG/3ML nebulizer solution 3 mL   methylPREDNISolone sodium succinate (SOLU-MEDROL) 125 mg/2 mL injection 125 mg    IV methylprednisolone will be converted to either a q12h or q24h frequency with the same total daily dose (TDD).  Ordered Dose: 1 to 125 mg TDD; convert to: TDD q24h.  Ordered Dose: 126 to 250 mg TDD; convert to: TDD div q12h.  Ordered Dose: >250 mg TDD; DAW.   sodium chloride 0.9 % bolus 1,000 mL   ondansetron (ZOFRAN) injection 4 mg   predniSONE (DELTASONE) 20 MG tablet    Sig: Take 2 tablets (40 mg total) by  mouth daily for 5 days.    Dispense:  10 tablet    Refill:  0   doxycycline (VIBRAMYCIN) 100 MG capsule    Sig: Take 1 capsule (100 mg total) by mouth 2 (two) times daily for 7 days.    Dispense:  14 capsule    Refill:  0   guaiFENesin-codeine  100-10 MG/5ML syrup    Sig: Take 5 mLs by mouth 2 (two) times daily as needed for cough.    Dispense:  118 mL    Refill:  0   oxymetazoline (AFRIN NASAL SPRAY) 0.05 % nasal spray    Sig: Place 1 spray into both nostrils 2 (two) times daily for 3 days.    Dispense:  15 mL    Refill:  0   albuterol (VENTOLIN HFA) 108 (90 Base) MCG/ACT inhaler    Sig: Inhale 1-2 puffs into the lungs every 6 (six) hours as needed for wheezing or shortness of breath.    Dispense:  1 each    Refill:  0    -I have reviewed the patients home medicines and have made adjustments as needed   Consultations Obtained: na   Cardiac Monitoring: The patient was maintained on a cardiac monitor.  I personally viewed and interpreted the cardiac monitored which showed an underlying rhythm of: nsr Continuous pulse oximetry interpreted by myself, 97% on RA.    Social Determinants of Health:  Diagnosis or treatment significantly limited by social determinants of health: obesity   Reevaluation: After the interventions noted above, I reevaluated the patient and found that they have improved  Co morbidities that complicate the patient evaluation  Past Medical History:  Diagnosis Date   Arthritis    Diabetes mellitus without complication (HCC)    Hypertension    Obstructive sleep apnea on CPAP       Dispostion: Disposition decision including need for hospitalization was considered, and patient discharged from emergency department.    Final Clinical Impression(s) / ED Diagnoses Final diagnoses:  COPD with acute exacerbation (HCC)        Sloan Leiter, DO 05/15/23 2048

## 2023-05-15 NOTE — ED Notes (Signed)

## 2023-06-17 ENCOUNTER — Emergency Department (HOSPITAL_BASED_OUTPATIENT_CLINIC_OR_DEPARTMENT_OTHER)
Admission: EM | Admit: 2023-06-17 | Discharge: 2023-06-17 | Disposition: A | Discharge status: Home / Self Care | Attending: Emergency Medicine | Admitting: Emergency Medicine

## 2023-06-17 ENCOUNTER — Emergency Department (HOSPITAL_BASED_OUTPATIENT_CLINIC_OR_DEPARTMENT_OTHER)

## 2023-06-17 ENCOUNTER — Encounter (HOSPITAL_BASED_OUTPATIENT_CLINIC_OR_DEPARTMENT_OTHER): Payer: Self-pay

## 2023-06-17 ENCOUNTER — Other Ambulatory Visit: Payer: Self-pay

## 2023-06-17 DIAGNOSIS — J019 Acute sinusitis, unspecified: Secondary | ICD-10-CM | POA: Insufficient documentation

## 2023-06-17 DIAGNOSIS — R000 Tachycardia, unspecified: Secondary | ICD-10-CM | POA: Diagnosis not present

## 2023-06-17 DIAGNOSIS — J069 Acute upper respiratory infection, unspecified: Secondary | ICD-10-CM | POA: Diagnosis not present

## 2023-06-17 DIAGNOSIS — Z8673 Personal history of transient ischemic attack (TIA), and cerebral infarction without residual deficits: Secondary | ICD-10-CM | POA: Insufficient documentation

## 2023-06-17 DIAGNOSIS — I10 Essential (primary) hypertension: Secondary | ICD-10-CM | POA: Insufficient documentation

## 2023-06-17 DIAGNOSIS — R059 Cough, unspecified: Secondary | ICD-10-CM | POA: Insufficient documentation

## 2023-06-17 DIAGNOSIS — E119 Type 2 diabetes mellitus without complications: Secondary | ICD-10-CM | POA: Insufficient documentation

## 2023-06-17 DIAGNOSIS — J029 Acute pharyngitis, unspecified: Secondary | ICD-10-CM | POA: Diagnosis present

## 2023-06-17 LAB — CBC
HCT: 48.3 % — ABNORMAL HIGH (ref 36.0–46.0)
Hemoglobin: 15.2 g/dL — ABNORMAL HIGH (ref 12.0–15.0)
MCH: 24.6 pg — ABNORMAL LOW (ref 26.0–34.0)
MCHC: 31.5 g/dL (ref 30.0–36.0)
MCV: 78 fL — ABNORMAL LOW (ref 80.0–100.0)
Platelets: 457 10*3/uL — ABNORMAL HIGH (ref 150–400)
RBC: 6.19 MIL/uL — ABNORMAL HIGH (ref 3.87–5.11)
RDW: 18.9 % — ABNORMAL HIGH (ref 11.5–15.5)
WBC: 18.3 10*3/uL — ABNORMAL HIGH (ref 4.0–10.5)
nRBC: 0 % (ref 0.0–0.2)

## 2023-06-17 LAB — RESP PANEL BY RT-PCR (RSV, FLU A&B, COVID)  RVPGX2
Influenza A by PCR: NEGATIVE
Influenza B by PCR: NEGATIVE
Resp Syncytial Virus by PCR: NEGATIVE
SARS Coronavirus 2 by RT PCR: NEGATIVE

## 2023-06-17 LAB — BASIC METABOLIC PANEL WITH GFR
Anion gap: 22 — ABNORMAL HIGH (ref 5–15)
BUN: 20 mg/dL (ref 8–23)
CO2: 20 mmol/L — ABNORMAL LOW (ref 22–32)
Calcium: 10.1 mg/dL (ref 8.9–10.3)
Chloride: 94 mmol/L — ABNORMAL LOW (ref 98–111)
Creatinine, Ser: 0.98 mg/dL (ref 0.44–1.00)
GFR, Estimated: >60 mL/min (ref >60)
Glucose, Bld: 244 mg/dL — ABNORMAL HIGH (ref 70–99)
Potassium: 4.2 mmol/L (ref 3.5–5.1)
Sodium: 135 mmol/L (ref 135–145)

## 2023-06-17 LAB — GROUP A STREP BY PCR: Group A Strep by PCR: NOT DETECTED

## 2023-06-17 MED ORDER — SODIUM CHLORIDE 0.9 % IV BOLUS
500.0000 mL | Freq: Once | INTRAVENOUS | Status: AC
  Administered 2023-06-17: 500 mL via INTRAVENOUS

## 2023-06-17 MED ORDER — DOXYCYCLINE HYCLATE 100 MG PO TABS: 100.0000 mg | ORAL_TABLET | Freq: Two times a day (BID) | ORAL | 0 refills | Status: AC

## 2023-06-17 MED ORDER — ONDANSETRON 8 MG PO TBDP: 8.0000 mg | ORAL_TABLET | Freq: Three times a day (TID) | ORAL | 0 refills | Status: AC | PRN

## 2023-06-17 NOTE — ED Triage Notes (Signed)
 Pt reports HA, sore throat and cough for one week. Difficulty eating and drinking. Nausea. Woke up with swelling under right jaw

## 2023-06-17 NOTE — ED Provider Notes (Signed)
 [place] [facility] [place] Provider Note   CSN: [id number] Arrival date & time: 06/17/23  0732     History  Chief Complaint  Patient presents with   Sore Throat    Alexandra Stanton is a 68 y.o. female.   Sore Throat     Patient has a history of hypertension diabetes prior stroke.  She presents ED with complaints of headache sore throat cough ongoing for 1 week.  Patient states she has not been feeling well.  She thought she had strep throat.  She was hoping it would get better.  She started having a cough as well as persistent headache.  She has not been eating or drinking well because of her sore throat.  She has had some nausea.  This morning she noticed that she had some swelling underneath her right jaw so she came to the ED for evaluation.  She is not aware of any fevers.  She is not having any shortness of breath.  No chest pain.  No focal numbness or weakness but generally feeling weak  Home Medications Prior to Admission medications   Medication Sig Start Date End Date Taking? Authorizing Provider  doxycycline  (VIBRA -TABS) 100 MG tablet Take 1 tablet (100 mg total) by mouth 2 (two) times daily. 06/17/23  Yes Trish Furl, MD  ondansetron  (ZOFRAN -ODT) 8 MG disintegrating tablet Take 1 tablet (8 mg total) by mouth every 8 (eight) hours as needed for nausea or vomiting. 06/17/23  Yes Trish Furl, MD  albuterol  (VENTOLIN  HFA) 108 [phone] Base) MCG/ACT inhaler Inhale 1-2 puffs into the lungs every 6 (six) hours as needed for wheezing or shortness of breath. 05/15/23   Teddi Favors, DO  guaiFENesin -codeine  100-10 MG/5ML syrup Take 5 mLs by mouth 2 (two) times daily as needed for cough. 05/15/23   Teddi Favors, DO      Allergies    Erythromycin, Penicillins, Cephalexin, Clindamycin, Codeine , and Nsaids    Review of Systems   Review of Systems  Physical Exam Updated Vital Signs BP 124/79   Pulse (!) 110   Temp 98.3 F (36.8 C) (Oral)   Resp 18   Wt 92.5  kg   SpO2 96%   BMI 36.14 kg/m  Physical Exam Vitals and nursing note reviewed.  Constitutional:      Appearance: She is well-developed. She is ill-appearing. She is not diaphoretic.  HENT:     Head: Normocephalic and atraumatic.     Right Ear: Tympanic membrane and external ear normal.     Left Ear: Tympanic membrane and external ear normal.     Nose: No rhinorrhea.     Mouth/Throat:     Pharynx: No oropharyngeal exudate or posterior oropharyngeal erythema.     Tonsils: No tonsillar exudate or tonsillar abscesses.  Eyes:     General: No scleral icterus.       Right eye: No discharge.        Left eye: No discharge.     Conjunctiva/sclera: Conjunctivae normal.  Neck:     Trachea: No tracheal deviation.     Comments: Tenderness palpation submandibular region bilaterally, mild lymphadenopathy noted, no trismus Cardiovascular:     Rate and Rhythm: Regular rhythm. Tachycardia present.  Pulmonary:     Effort: Pulmonary effort is normal. No respiratory distress.     Breath sounds: Normal breath sounds. No stridor. No wheezing or rales.  Abdominal:     General: Bowel sounds are normal. There is no distension.  Palpations: Abdomen is soft.     Tenderness: There is no abdominal tenderness. There is no guarding or rebound.  Musculoskeletal:        General: No tenderness or deformity.     Cervical back: Neck supple.  Skin:    General: Skin is warm and dry.     Findings: No rash.  Neurological:     General: No focal deficit present.     Mental Status: She is alert.     Cranial Nerves: No cranial nerve deficit, dysarthria or facial asymmetry.     Sensory: No sensory deficit.     Motor: No abnormal muscle tone or seizure activity.     Coordination: Coordination normal.  Psychiatric:        Mood and Affect: Mood normal.     ED Results / Procedures / Treatments   Labs (all labs ordered are listed, but only abnormal results are displayed) Labs Reviewed  CBC - Abnormal; Notable  for the following components:      Result Value   WBC 18.3 (*)    RBC 6.19 (*)    Hemoglobin 15.2 (*)    HCT 48.3 (*)    MCV 78.0 (*)    MCH 24.6 (*)    RDW 18.9 (*)    Platelets 457 (*)    All other components within normal limits  BASIC METABOLIC PANEL WITH GFR - Abnormal; Notable for the following components:   Chloride 94 (*)    CO2 20 (*)    Glucose, Bld 244 (*)    Anion gap 22 (*)    All other components within normal limits  RESP PANEL BY RT-PCR (RSV, FLU A&B, COVID)  [id number]  GROUP A STREP BY PCR    EKG None  Radiology DG Chest 2 View Result Date: 06/17/2023 CLINICAL DATA:  68 year old female with cough, sore throat.  Nausea. EXAM: CHEST - 2 VIEW COMPARISON:  Chest radiographs 05/15/2023 and earlier. FINDINGS: Semi upright AP and lateral views of the chest 0804 hours. Low lung volumes on both views, similar on the lateral to prior exams. Mediastinal contours are stable, mild chronic tortuosity of the thoracic aorta. Visualized tracheal air column is within normal limits. No pneumothorax, pulmonary edema, pleural effusion or confluent lung opacity. Mild crowding of markings diffusely. Chronic severe right shoulder degeneration, left shoulder arthroplasty. Dextroconvex scoliosis. Disc and endplate degeneration in the spine. No acute osseous abnormality identified. Negative visible bowel gas. IMPRESSION: Lower lung volumes, no acute cardiopulmonary abnormality. Electronically Signed   By: Marlise Simpers M.D.   On: 06/17/2023 08:31    Procedures Procedures    Medications Ordered in ED Medications  sodium chloride  0.9 % bolus 500 mL (0 mLs Intravenous Stopped 06/17/23 0913)    ED Course/ Medical Decision Making/ A&P Clinical Course as of 06/17/23 0939  Sat Jun 17, 2023  0901 Group A Strep by PCR negativr [JK]  0901 Resp panel by RT-PCR (RSV, Flu A&B, Covid) Anterior Nasal Swab nl [JK]  0901 CBC(!) Wbc elevated, hgb increased, likely component of hemoconcenration [JK]  0912 DG  Chest 2 View Cxr negative [JK]  0914 Basic metabolic panel(!) Metabolic panel shows decreased bicarb increased anion gap elevated glucose.  Suspect this is more likely related to dehydration as opposed to diabetic ketoacidosis [JK]    Clinical Course User Index [JK] Trish Furl, MD  Medical Decision Making Amount and/or Complexity of Data Reviewed Labs: ordered. Decision-making details documented in ED Course. Radiology: ordered. Decision-making details documented in ED Course.  Risk Prescription drug management.   Patient presented with URI symptoms, sore throat swelling in the submandibular region.  No evidence of trismus on exam.  No complaints of dental pain or significant swelling to suggest dental infection.  No evidence of peritonsillar abscess or significant oropharyngeal swelling noted on exam.  Suspect patient's URI symptoms likely contributing to her sore throat.  She has negative COVID flu RSV.  X-ray does not show pneumonia.  Patient was treated with IV fluids and antinausea medications.  Suspect she had a component of dehydration from her decreased p.o. intake.  Patient may have a component of sinusitis with her 1 week of symptoms.  Will discharge home with course of antibiotics encouraged increased p.o. fluid intake and Zofran .  Outpatient follow-up with PCP.  Warning signs precautions discussed.        Final Clinical Impression(s) / ED Diagnoses Final diagnoses:  Upper respiratory tract infection, unspecified type  Acute sinusitis, recurrence not specified, unspecified location    Rx / DC Orders ED Discharge Orders          Ordered    doxycycline  (VIBRA -TABS) 100 MG tablet  2 times daily        06/17/23 0939    ondansetron  (ZOFRAN -ODT) 8 MG disintegrating tablet  Every 8 hours PRN        06/17/23 0939              Trish Furl, MD 06/17/23 [phone]

## 2023-06-17 NOTE — Discharge Instructions (Addendum)
 Take the antibiotics as prescribed.  The Zofran  is to help with nausea.  Make sure to stay well-hydrated.  Follow-up with your doctor next week to be rechecked to make sure you are improving.  Return as needed for worsening symptoms

## 2023-07-17 ENCOUNTER — Encounter (HOSPITAL_BASED_OUTPATIENT_CLINIC_OR_DEPARTMENT_OTHER): Payer: Self-pay | Admitting: Emergency Medicine

## 2023-07-17 ENCOUNTER — Emergency Department (HOSPITAL_BASED_OUTPATIENT_CLINIC_OR_DEPARTMENT_OTHER)
Admission: EM | Admit: 2023-07-17 | Discharge: 2023-07-18 | Disposition: A | Discharge status: Home / Self Care | Attending: Emergency Medicine | Admitting: Emergency Medicine

## 2023-07-17 ENCOUNTER — Other Ambulatory Visit: Payer: Self-pay

## 2023-07-17 ENCOUNTER — Emergency Department (HOSPITAL_BASED_OUTPATIENT_CLINIC_OR_DEPARTMENT_OTHER)

## 2023-07-17 DIAGNOSIS — B372 Candidiasis of skin and nail: Secondary | ICD-10-CM | POA: Insufficient documentation

## 2023-07-17 DIAGNOSIS — S5002XA Contusion of left elbow, initial encounter: Secondary | ICD-10-CM | POA: Insufficient documentation

## 2023-07-17 DIAGNOSIS — S59902A Unspecified injury of left elbow, initial encounter: Secondary | ICD-10-CM | POA: Diagnosis present

## 2023-07-17 DIAGNOSIS — W19XXXA Unspecified fall, initial encounter: Secondary | ICD-10-CM | POA: Diagnosis not present

## 2023-07-17 LAB — CBC WITH DIFFERENTIAL/PLATELET
Abs Immature Granulocytes: 0.03 K/uL (ref 0.00–0.07)
Basophils Absolute: 0.1 K/uL (ref 0.0–0.1)
Basophils Relative: 1 %
Eosinophils Absolute: 0.2 K/uL (ref 0.0–0.5)
Eosinophils Relative: 2 %
HCT: 46.8 % — ABNORMAL HIGH (ref 36.0–46.0)
Hemoglobin: 14.5 g/dL (ref 12.0–15.0)
Immature Granulocytes: 1 %
Lymphocytes Relative: 27 %
Lymphs Abs: 1.7 K/uL (ref 0.7–4.0)
MCH: 25 pg — ABNORMAL LOW (ref 26.0–34.0)
MCHC: 31 g/dL (ref 30.0–36.0)
MCV: 80.7 fL (ref 80.0–100.0)
Monocytes Absolute: 0.6 K/uL (ref 0.1–1.0)
Monocytes Relative: 9 %
Neutro Abs: 3.8 K/uL (ref 1.7–7.7)
Neutrophils Relative %: 60 %
Platelets: 363 K/uL (ref 150–400)
RBC: 5.8 MIL/uL — ABNORMAL HIGH (ref 3.87–5.11)
RDW: 18.8 % — ABNORMAL HIGH (ref 11.5–15.5)
WBC: 6.4 K/uL (ref 4.0–10.5)
nRBC: 0 % (ref 0.0–0.2)

## 2023-07-17 LAB — COMPREHENSIVE METABOLIC PANEL WITH GFR
ALT: 11 U/L (ref 0–44)
AST: 21 U/L (ref 15–41)
Albumin: 4.4 g/dL (ref 3.5–5.0)
Alkaline Phosphatase: 64 U/L (ref 38–126)
Anion gap: 13 (ref 5–15)
BUN: 14 mg/dL (ref 8–23)
CO2: 25 mmol/L (ref 22–32)
Calcium: 9.8 mg/dL (ref 8.9–10.3)
Chloride: 101 mmol/L (ref 98–111)
Creatinine, Ser: 0.83 mg/dL (ref 0.44–1.00)
GFR, Estimated: >60 mL/min
Glucose, Bld: 233 mg/dL — ABNORMAL HIGH (ref 70–99)
Potassium: 3.9 mmol/L (ref 3.5–5.1)
Sodium: 138 mmol/L (ref 135–145)
Total Bilirubin: 0.3 mg/dL (ref 0.0–1.2)
Total Protein: 7.2 g/dL (ref 6.5–8.1)

## 2023-07-17 NOTE — ED Triage Notes (Signed)
 Pt POV slow gait- c/o fall and injury to L elbow. Pt reports difficulty straightening arm.  Still able to move fingers, skin warm to touch.  Also c/o  rash underneath both breast, another rash on back, and also reports when having BM's for last 2 weeks, stools are dark, and green.  Reports poor po intake x2 weeks due getting nauseous after eating.

## 2023-07-18 DIAGNOSIS — S5002XA Contusion of left elbow, initial encounter: Secondary | ICD-10-CM | POA: Diagnosis not present

## 2023-07-18 MED ORDER — CYCLOBENZAPRINE HCL 10 MG PO TABS: 10.0000 mg | ORAL_TABLET | Freq: Two times a day (BID) | ORAL | 0 refills | Status: AC | PRN

## 2023-07-18 MED ORDER — FLUCONAZOLE 150 MG PO TABS
150.0000 mg | ORAL_TABLET | Freq: Once | ORAL | Status: AC
  Administered 2023-07-18: 150 mg via ORAL
  Filled 2023-07-18: qty 1

## 2023-07-18 MED ORDER — FLUCONAZOLE 150 MG PO TABS: 150.0000 mg | ORAL_TABLET | Freq: Once | ORAL | 0 refills | Status: AC

## 2023-07-18 NOTE — Discharge Instructions (Addendum)
 Please take your second diflucan dose in 1 week.

## 2023-07-18 NOTE — ED Provider Notes (Signed)
 [facility]  Provider Note  CSN: [id number] Arrival date & time: 07/17/23 2036  History Chief Complaint  Patient presents with   Multiple Complaints    Alexandra Stanton is a 68 y.o. female here with multiple complaints, primarily here for L elbow pain after a fall earlier today. Also reports itching rash on chest, under breasts and on her back. For several weeks, not responding to OTC yeast medications.   She also reports green stools recently and is concerned about her gall bladder.    Home Medications Prior to Admission medications   Medication Sig Start Date End Date Taking? Authorizing Provider  cyclobenzaprine (FLEXERIL) 10 MG tablet Take 1 tablet (10 mg total) by mouth 2 (two) times daily as needed for muscle spasms. 07/18/23  Yes Charmayne Cooper, MD  fluconazole (DIFLUCAN) 150 MG tablet Take 1 tablet (150 mg total) by mouth once for 1 dose. 07/25/23 07/25/23 Yes Charmayne Cooper, MD  albuterol  (VENTOLIN  HFA) 108 [phone] Base) MCG/ACT inhaler Inhale 1-2 puffs into the lungs every 6 (six) hours as needed for wheezing or shortness of breath. 05/15/23   Teddi Favors, DO  doxycycline  (VIBRA -TABS) 100 MG tablet Take 1 tablet (100 mg total) by mouth 2 (two) times daily. 06/17/23   Trish Furl, MD  guaiFENesin -codeine  100-10 MG/5ML syrup Take 5 mLs by mouth 2 (two) times daily as needed for cough. 05/15/23   Teddi Favors, DO  ondansetron  (ZOFRAN -ODT) 8 MG disintegrating tablet Take 1 tablet (8 mg total) by mouth every 8 (eight) hours as needed for nausea or vomiting. 06/17/23   Trish Furl, MD     Allergies    Erythromycin, Penicillins, Cephalexin, Clindamycin, Codeine , and Nsaids   Review of Systems   Review of Systems Please see HPI for pertinent positives and negatives  Physical Exam BP (!) 182/97   Pulse 89   Temp (!) 97.1 F (36.2 C)   Resp 15   Ht 5\' 3"  (1.6 m)   Wt 93.4 kg   SpO2 99%   BMI 36.49 kg/m   Physical Exam Vitals  and nursing note reviewed.  Constitutional:      Appearance: Normal appearance.  HENT:     Head: Normocephalic and atraumatic.     Nose: Nose normal.     Mouth/Throat:     Mouth: Mucous membranes are moist.  Eyes:     Extraocular Movements: Extraocular movements intact.     Conjunctiva/sclera: Conjunctivae normal.  Cardiovascular:     Rate and Rhythm: Normal rate.     Pulses: Normal pulses.  Pulmonary:     Effort: Pulmonary effort is normal.     Breath sounds: Normal breath sounds.  Abdominal:     General: Abdomen is flat.     Palpations: Abdomen is soft.     Tenderness: There is no abdominal tenderness.  Musculoskeletal:        General: Tenderness (L lateral elbow, decreased ROM due to pain) present. No swelling or deformity.     Cervical back: Neck supple.  Skin:    General: Skin is warm and dry.     Findings: Rash (candidal rash under breast and on her back) present.  Neurological:     General: No focal deficit present.     Mental Status: She is alert.  Psychiatric:        Mood and Affect: Mood normal.     ED Results / Procedures / Treatments   EKG None  Procedures Procedures  Medications Ordered in the ED Medications  fluconazole (DIFLUCAN) tablet 150 mg (has no administration in time range)    Initial Impression and Plan  Patient here with multiple complaints, some of which are chronic. She admits to poor glucose control, no longer has a PCP. For her elbow injury, I personally viewed the images from radiology studies and agree with radiologist interpretation: Xrays neg for fracture. She had labs done in triage showing unremarkable CBC, CMP with hyperglycemia otherwise normal. She was encouraged to re-establish with PCP for long term management of her chronic medical issues. Will provide a sling for her elbow injury, muscle relaxer as needed. Rx for diflucan for her candidal rash, advised to continue topical antifungals as well. No further workup needed for green  stools, recommend PCP follow up.   ED Course       MDM Rules/Calculators/A&P Medical Decision Making Problems Addressed: Contusion of left elbow, initial encounter: acute illness or injury Cutaneous candidiasis: chronic illness or injury with exacerbation, progression, or side effects of treatment  Amount and/or Complexity of Data Reviewed Labs: ordered. Decision-making details documented in ED Course. Radiology: ordered and independent interpretation performed. Decision-making details documented in ED Course.  Risk Prescription drug management.     Final Clinical Impression(s) / ED Diagnoses Final diagnoses:  Contusion of left elbow, initial encounter  Cutaneous candidiasis    Rx / DC Orders ED Discharge Orders          Ordered    fluconazole (DIFLUCAN) 150 MG tablet   Once        07/18/23 0018    cyclobenzaprine (FLEXERIL) 10 MG tablet  2 times daily PRN        07/18/23 0018             Charmayne Cooper, MD 07/18/23 [phone]
